# Patient Record
Sex: Male | Born: 1995 | Race: White | Hispanic: No | Smoking: Current every day smoker
Health system: Southern US, Community
[De-identification: ages and names within clinical notes are randomized; demographics above are authoritative.]

---
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
## 2013-06-27 ENCOUNTER — Emergency Department: Payer: Self-pay | Admitting: Emergency Medicine

## 2015-07-03 ENCOUNTER — Emergency Department
Admission: EM | Admit: 2015-07-03 | Discharge: 2015-07-03 | Disposition: A | Discharge status: Home / Self Care | Payer: Self-pay | Attending: Emergency Medicine | Admitting: Emergency Medicine

## 2015-07-03 ENCOUNTER — Encounter: Payer: Self-pay | Admitting: Emergency Medicine

## 2015-07-03 ENCOUNTER — Emergency Department: Payer: Self-pay

## 2015-07-03 DIAGNOSIS — S0512XA Contusion of eyeball and orbital tissues, left eye, initial encounter: Secondary | ICD-10-CM | POA: Insufficient documentation

## 2015-07-03 DIAGNOSIS — Y929 Unspecified place or not applicable: Secondary | ICD-10-CM | POA: Insufficient documentation

## 2015-07-03 DIAGNOSIS — S60221A Contusion of right hand, initial encounter: Secondary | ICD-10-CM | POA: Insufficient documentation

## 2015-07-03 DIAGNOSIS — F17210 Nicotine dependence, cigarettes, uncomplicated: Secondary | ICD-10-CM | POA: Insufficient documentation

## 2015-07-03 DIAGNOSIS — Y999 Unspecified external cause status: Secondary | ICD-10-CM | POA: Insufficient documentation

## 2015-07-03 DIAGNOSIS — S60222A Contusion of left hand, initial encounter: Secondary | ICD-10-CM | POA: Insufficient documentation

## 2015-07-03 DIAGNOSIS — Y939 Activity, unspecified: Secondary | ICD-10-CM | POA: Insufficient documentation

## 2015-07-03 MED ORDER — TRAMADOL HCL 50 MG PO TABS
50.0000 mg | ORAL_TABLET | Freq: Once | ORAL | Status: AC
  Administered 2015-07-03: 50 mg via ORAL
  Filled 2015-07-03: qty 1

## 2015-07-03 MED ORDER — NAPROXEN 500 MG PO TABS: 500.0000 mg | ORAL_TABLET | Freq: Two times a day (BID) | ORAL | Status: AC

## 2015-07-03 MED ORDER — NAPROXEN 500 MG PO TABS
500.0000 mg | ORAL_TABLET | Freq: Once | ORAL | Status: AC
  Administered 2015-07-03: 500 mg via ORAL
  Filled 2015-07-03: qty 1

## 2015-07-03 MED ORDER — TRAMADOL HCL 50 MG PO TABS: 50.0000 mg | ORAL_TABLET | Freq: Four times a day (QID) | ORAL | Status: AC | PRN

## 2015-07-03 MED ORDER — TETANUS-DIPHTH-ACELL PERTUSSIS 5-2.5-18.5 LF-MCG/0.5 IM SUSP
0.5000 mL | Freq: Once | INTRAMUSCULAR | Status: AC
  Administered 2015-07-03: 0.5 mL via INTRAMUSCULAR
  Filled 2015-07-03: qty 0.5
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
## 2015-07-03 NOTE — Discharge Instructions (Signed)
Hand Contusion  A hand contusion is a deep bruise on your hand area. Contusions are the result of an injury that caused bleeding under the skin. The contusion may turn blue, purple, or yellow. Minor injuries will give you a painless contusion, but more severe contusions may stay painful and swollen for a few weeks.  CAUSES   A contusion is usually caused by a blow, trauma, or direct force to an area of the body.  SYMPTOMS    Swelling and redness of the injured area.   Discoloration of the injured area.   Tenderness and soreness of the injured area.   Pain.  DIAGNOSIS   The diagnosis can be made by taking a history and performing a physical exam. An X-ray, CT scan, or MRI may be needed to determine if there were any associated injuries, such as broken bones (fractures).  TREATMENT   Often, the best treatment for a hand contusion is resting, elevating, icing, and applying cold compresses to the injured area. Over-the-counter medicines may also be recommended for pain control.  HOME CARE INSTRUCTIONS    Put ice on the injured area.    Put ice in a plastic bag.    Place a towel between your skin and the bag.    Leave the ice on for 15-20 minutes, 03-04 times a day.   Only take over-the-counter or prescription medicines as directed by your caregiver. Your caregiver may recommend avoiding anti-inflammatory medicines (aspirin, ibuprofen, and naproxen) for 48 hours because these medicines may increase bruising.   If told, use an elastic wrap as directed. This can help reduce swelling. You may remove the wrap for sleeping, showering, and bathing. If your fingers become numb, cold, or blue, take the wrap off and reapply it more loosely.   Elevate your hand with pillows to reduce swelling.   Avoid overusing your hand if it is painful.  SEEK IMMEDIATE MEDICAL CARE IF:    You have increased redness, swelling, or pain in your hand.   Your swelling or pain is not relieved with medicines.   You have loss of feeling in  your hand or are unable to move your fingers.   Your hand turns cold or blue.   You have pain when you move your fingers.   Your hand becomes warm to the touch.   Your contusion does not improve in 2 days.  MAKE SURE YOU:    Understand these instructions.   Will watch your condition.   Will get help right away if you are not doing well or get worse.     This information is not intended to replace advice given to you by your health care provider. Make sure you discuss any questions you have with your health care provider.     Document Released: 06/28/2001 Document Revised: 10/01/2011 Document Reviewed: 06/30/2011  Elsevier Interactive Patient Education 2016 [employer]
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
## 2018-02-18 ENCOUNTER — Other Ambulatory Visit: Payer: Self-pay

## 2018-02-18 ENCOUNTER — Encounter: Payer: Self-pay | Admitting: Emergency Medicine

## 2018-02-18 ENCOUNTER — Emergency Department
Admission: EM | Admit: 2018-02-18 | Discharge: 2018-02-18 | Disposition: A | Discharge status: Home / Self Care | Payer: Self-pay | Attending: Emergency Medicine | Admitting: Emergency Medicine

## 2018-02-18 DIAGNOSIS — F17210 Nicotine dependence, cigarettes, uncomplicated: Secondary | ICD-10-CM | POA: Insufficient documentation

## 2018-02-18 DIAGNOSIS — R51 Headache: Secondary | ICD-10-CM | POA: Insufficient documentation

## 2018-02-18 DIAGNOSIS — R519 Headache, unspecified: Secondary | ICD-10-CM

## 2018-02-18 MED ORDER — BUTALBITAL-APAP-CAFFEINE 50-325-40 MG PO TABS
1.0000 | ORAL_TABLET | Freq: Once | ORAL | Status: AC
  Administered 2018-02-18: 1 via ORAL
  Filled 2018-02-18: qty 1

## 2018-02-18 NOTE — ED Provider Notes (Signed)
[facility] Emergency Department Provider Note   First MD Initiated Contact with Patient 02/18/18 0530     (approximate)  I have reviewed the triage vital signs and the nursing notes.   HISTORY  Chief Complaint Headache    HPI Herbert Middleton is a 23 y.o. male presents to the emergency department with headache since Monday that is frontal without any associated symptoms no weakness numbness gait instability or visual changes.  Patient denies any nausea or vomiting.  Patient states that pain actually improved today with current pain score of 3 out of 10.   Past medical history None There are no active problems to display for this patient.   History reviewed. No pertinent surgical history.  Prior to Admission medications   Medication Sig Start Date End Date Taking? Authorizing Provider  naproxen (NAPROSYN) 500 MG tablet Take 1 tablet (500 mg total) by mouth 2 (two) times daily with a meal. 07/03/15   Triplett, Cari B, FNP  traMADol (ULTRAM) 50 MG tablet Take 1 tablet (50 mg total) by mouth every 6 (six) hours as needed. 07/03/15   Chinita Pester, FNP    Allergies Patient has no known allergies.  No family history on file.  Social History Social History   Tobacco Use  . Smoking status: Current Every Day Smoker    Packs/day: 1.00    Types: Cigarettes  . Smokeless tobacco: Never Used  Substance Use Topics  . Alcohol use: No  . Drug use: Not on file    Review of Systems Constitutional: No fever/chills Eyes: No visual changes. ENT: No sore throat. Cardiovascular: Denies chest pain. Respiratory: Denies shortness of breath. Gastrointestinal: No abdominal pain.  No nausea, no vomiting.  No diarrhea.  No constipation. Genitourinary: Negative for dysuria. Musculoskeletal: Negative for neck pain.  Negative for back pain. Integumentary: Negative for rash. Neurological: Positive for headaches, negative for focal weakness or  numbness.   ____________________________________________   PHYSICAL EXAM:  VITAL SIGNS: ED Triage Vitals  Enc Vitals Group     BP 02/18/18 0409 (!) 142/68     Pulse Rate 02/18/18 0409 83     Resp 02/18/18 0409 18     Temp 02/18/18 0409 98 F (36.7 C)     Temp Source 02/18/18 0409 Oral     SpO2 02/18/18 0409 97 %     Weight 02/18/18 0408 90.7 kg (200 lb)     Height 02/18/18 0408 1.829 m (6')     Head Circumference --      Peak Flow --      Pain Score 02/18/18 0408 3     Pain Loc --      Pain Edu? --      Excl. in GC? --     Constitutional: Alert and oriented. Well appearing and in no acute distress. Eyes: Conjunctivae are normal. PERRL. EOMI. Head: Atraumatic. Mouth/Throat: Mucous membranes are moist.  Oropharynx non-erythematous. Neck: No stridor.  No meningeal signs.   Cardiovascular: Normal rate, regular rhythm. Good peripheral circulation. Grossly normal heart sounds. Respiratory: Normal respiratory effort.  No retractions. Lungs CTAB. Gastrointestinal: Soft and nontender. No distention.  Musculoskeletal: No lower extremity tenderness nor edema. No gross deformities of extremities. Neurologic:  Normal speech and language. No gross focal neurologic deficits are appreciated.  Skin:  Skin is warm, dry and intact. No rash noted. Psychiatric: Mood and affect are normal. Speech and behavior are normal.  ____________________________________________     Procedures   ____________________________________________  INITIAL IMPRESSION / ASSESSMENT AND PLAN / ED COURSE  As part of my medical decision making, I reviewed the following data within the electronic MEDICAL RECORD NUMBER  23 year old male presenting with above-stated history and physical exam secondary to headache that is currently 3 out of 10.  No focal neurological deficit and such imaging is not performed.  Did review patient's CTs head from 2012 which was unremarkable.  SPECT tension  or migraine headache as  etiology for the patient's pain ____________________________________________  FINAL CLINICAL IMPRESSION(S) / ED DIAGNOSES  Final diagnoses:  Acute nonintractable headache, unspecified headache type     MEDICATIONS GIVEN DURING THIS VISIT:  Medications  butalbital-acetaminophen-caffeine (FIORICET, ESGIC) 50-325-40 MG per tablet 1 tablet (has no administration in time range)     ED Discharge Orders    None       Note:  This document was prepared using Dragon voice recognition software and may include unintentional dictation errors.    Darci CurrentBrown, [place], MD 02/18/18 [phone]

## 2018-02-18 NOTE — ED Notes (Signed)
Pt reports on-going anterior localized headache since Monday of this week. Pt reports ocassional nausea, but denies vomiting. No diarrhea, abd pain, or SHOB. Pt reports taking OTC medications for HA PTA without relief. Pt also states he has a "bump" on his forehead superior to the left eye that has "been coming and going for the last year". Pt denies any head injury or trauma that could be responsible for the "bump". Pt denies bilateral/unilateral weakness or numbness, no facial droop, no visual or auditory changes.

## 2018-02-18 NOTE — ED Triage Notes (Signed)
Patient ambulatory to triage with steady gait, without difficulty or distress noted; pt reports frontal HA since Monday; denies hx of same; st has had a "knot" on forehead for a year with no known cause

## 2018-02-18 NOTE — ED Notes (Signed)
Dr Brown and Butch, RN at bedside at this time. 

## 2023-09-09 ENCOUNTER — Other Ambulatory Visit: Payer: Self-pay

## 2023-09-09 ENCOUNTER — Emergency Department
Admission: EM | Admit: 2023-09-09 | Discharge: 2023-09-09 | Disposition: A | Discharge status: Home / Self Care | Payer: Self-pay | Attending: Emergency Medicine | Admitting: Emergency Medicine

## 2023-09-09 ENCOUNTER — Encounter: Payer: Self-pay | Admitting: Emergency Medicine

## 2023-09-09 DIAGNOSIS — K644 Residual hemorrhoidal skin tags: Secondary | ICD-10-CM | POA: Insufficient documentation

## 2023-09-09 MED ORDER — HYDROCORTISONE ACETATE 25 MG RE SUPP: 25.0000 mg | Freq: Two times a day (BID) | RECTAL | 1 refills | Status: AC

## 2023-09-09 MED ORDER — IBUPROFEN 800 MG PO TABS: 800.0000 mg | ORAL_TABLET | Freq: Three times a day (TID) | ORAL | 0 refills | Status: AC | PRN

## 2023-09-09 NOTE — ED Triage Notes (Signed)
 Patient ambulatory to triage with steady gait, without difficulty or distress noted; pt reports hemorroids for mo; has not tried any home remedies; denies any accomp symptoms

## 2023-09-09 NOTE — ED Provider Notes (Signed)
 [facility] Provider Note    Event Date/Time   First MD Initiated Contact with Patient 09/09/23 0330     (approximate)   History   Hemorrhoids   HPI  Herbert Middleton is a 28 y.o. male with no significant past medical history who presents to the emergency department with rectal pain ongoing for several weeks.  Reports intermittent blood when he wipes and seeing blood in the stool.  No abdominal pain, melena.  Thinks he has hemorrhoids but has never seen a doctor.  No anal intercourse.  No discharge.   History provided by patient.    History reviewed. No pertinent past medical history.  History reviewed. No pertinent surgical history.  MEDICATIONS:  Prior to Admission medications   Medication Sig Start Date End Date Taking? Authorizing Provider  naproxen  (NAPROSYN ) 500 MG tablet Take 1 tablet (500 mg total) by mouth 2 (two) times daily with a meal. 07/03/15   Triplett, Cari B, FNP  traMADol  (ULTRAM ) 50 MG tablet Take 1 tablet (50 mg total) by mouth every 6 (six) hours as needed. 07/03/15   Herlinda Kirk NOVAK, FNP    Physical Exam   Triage Vital Signs: ED Triage Vitals  Encounter Vitals Group     BP 09/09/23 0358 133/86     Girls Systolic BP Percentile --      Girls Diastolic BP Percentile --      Boys Systolic BP Percentile --      Boys Diastolic BP Percentile --      Pulse Rate 09/09/23 0358 93     Resp 09/09/23 0358 20     Temp 09/09/23 0358 98.6 F (37 C)     Temp Source 09/09/23 0358 Oral     SpO2 09/09/23 0358 98 %     Weight 09/09/23 0322 170 lb (77.1 kg)     Height 09/09/23 0322 6' (1.829 m)     Head Circumference --      Peak Flow --      Pain Score 09/09/23 0358 4     Pain Loc --      Pain Education --      Exclude from Growth Chart --     Most recent vital signs: Vitals:   09/09/23 0358  BP: 133/86  Pulse: 93  Resp: 20  Temp: 98.6 F (37 C)  SpO2: 98%    CONSTITUTIONAL: Alert, responds appropriately to questions.  Well-appearing; well-nourished HEAD: Normocephalic, atraumatic EYES: Conjunctivae clear, pupils appear equal, sclera nonicteric ENT: normal nose; moist mucous membranes NECK: Supple, normal ROM CARD: RRR; S1 and S2 appreciated RESP: Normal chest excursion without splinting or tachypnea; breath sounds clear and equal bilaterally; no wheezes, no rhonchi, no rales, no hypoxia or respiratory distress, speaking full sentences ABD/GI: Non-distended; soft, non-tender, no rebound, no guarding, no peritoneal signs RECTAL: Patient has a nonthrombosed external hemorrhoid that is slightly tender to palpation without redness, warmth, crepitus, active bleeding or drainage.  Internal exam deferred.   BACK: The back appears normal EXT: Normal ROM in all joints; no deformity noted, no edema SKIN: Normal color for age and race; warm; no rash on exposed skin NEURO: Moves all extremities equally, normal speech PSYCH: The patient's mood and manner are appropriate.   ED Results / Procedures / Treatments   LABS: (all labs ordered are listed, but only abnormal results are displayed) Labs Reviewed - No data to display   EKG:  EKG Interpretation Date/Time:    Ventricular Rate:  PR Interval:    QRS Duration:    QT Interval:    QTC Calculation:   R Axis:      Text Interpretation:           RADIOLOGY: My personal review and interpretation of imaging:    I have personally reviewed all radiology reports.   No results found.   PROCEDURES:  Critical Care performed: No    Procedures    IMPRESSION / MDM / ASSESSMENT AND PLAN / ED COURSE  I reviewed the triage vital signs and the nursing notes.    Patient here with rectal pain.  Has external hemorrhoid on exam.  The patient is on the cardiac monitor to evaluate for evidence of arrhythmia and/or significant heart rate changes.   DIFFERENTIAL DIAGNOSIS (includes but not limited to):   Hemorrhoid, no sign of rectal abscess, no sign of  Fournier's gangrene   Patient's presentation is most consistent with acute, uncomplicated illness.   PLAN: Patient here with nonthrombosed external hemorrhoid that is not actively bleeding.  No sign of infection.  No sign of malignancy.  Will discharge with supportive care instructions, Anusol  suppositories.  Recommended witch hazel wipes, high-fiber diet, over-the-counter medications to prevent constipation, Tylenol  and ibuprofen  as needed.  Patient comfortable with this plan.  Will place PCP referral.  At this time, I do not feel there is any life-threatening condition present. I reviewed all nursing notes, vitals, pertinent previous records.  All lab and urine results, EKGs, imaging ordered have been independently reviewed and interpreted by myself.  I reviewed all available radiology reports from any imaging ordered this visit.  Based on my assessment, I feel the patient is safe to be discharged home without further emergent workup and can continue workup as an outpatient as needed. Discussed all findings, treatment plan as well as usual and customary return precautions.  They verbalize understanding and are comfortable with this plan.  Outpatient follow-up has been provided as needed.  All questions have been answered.  MEDICATIONS GIVEN IN ED: Medications - No data to display   ED COURSE: Unfortunately patient left the emergency department without receiving his discharge paperwork or his prescriptions.  I have attempted to call him multiple times at the number listed in his chart without any answer and there is no way to leave a voicemail.   CONSULTS:  none   OUTSIDE RECORDS REVIEWED: No prior records for review.       FINAL CLINICAL IMPRESSION(S) / ED DIAGNOSES   Final diagnoses:  External hemorrhoid     Rx / DC Orders   ED Discharge Orders          Ordered    Ambulatory Referral to Primary Care (Establish Care)        09/09/23 0409    hydrocortisone  (ANUSOL -HC) 25 MG  suppository  Every 12 hours        09/09/23 0409    ibuprofen  (ADVIL ) 800 MG tablet  Every 8 hours PRN        09/09/23 0409             Note:  This document was prepared using Dragon voice recognition software and may include unintentional dictation errors.   Draiden Mirsky, Josette SAILOR, [place] 09/09/23 [phone]

## 2023-09-09 NOTE — Discharge Instructions (Signed)
For your hemorrhoids, I recommend that you use a stool softener such as Colace 100 mg twice a day to keep your bowel movements soft.  I also recommend that you do not use toilet paper and instead use witch hazel wipes or baby wipes. A high-fiber diet will also help to keep your stools soft as well increasing your water intake. If you cannot eat foods high in fiber, you may use Benefiber or Metamucil over-the-counter once daily. This will help prevent constipation and keep stools soft as to not irritate your hemorrhoids more.  I also recommend over the counter preparation H for your hemorrhoids as well to help with inflammation.  Narcotic pain medication is often not recommended for pain from hemorrhoids as these medications slow gut motility worsening constipation and can therefore worsen rectal pain, bleeding and inflammation from hemorrhoids.

## 2023-09-09 NOTE — ED Notes (Signed)
 Attempted to contact pt x2 to update pharmacy. No answer, unable to leave voicemail.
